# Patient Record
Sex: Male | Born: 1990 | Race: Black or African American | Hispanic: No | Marital: Single
Health system: Southern US, Community
[De-identification: ages and names within clinical notes are randomized; demographics above are authoritative.]

---
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
## 2012-03-08 ENCOUNTER — Ambulatory Visit: Payer: Self-pay | Admitting: Family Medicine

## 2012-03-20 ENCOUNTER — Emergency Department: Payer: Self-pay | Admitting: Emergency Medicine

## 2012-03-20 LAB — ETHANOL
Ethanol %: <0.003 % (ref 0.000–0.080)
Ethanol: <3 mg/dL

## 2012-03-20 LAB — CBC
HCT: 44.7 % (ref 40.0–52.0)
HGB: 15.7 g/dL (ref 13.0–18.0)
MCH: 31.4 pg (ref 26.0–34.0)
MCHC: 35.2 g/dL (ref 32.0–36.0)
MCV: 89 fL (ref 80–100)
RDW: 13.4 % (ref 11.5–14.5)

## 2012-03-20 LAB — COMPREHENSIVE METABOLIC PANEL
Anion Gap: 9 (ref 7–16)
BUN: 12 mg/dL (ref 7–18)
Bilirubin,Total: 0.5 mg/dL (ref 0.2–1.0)
Chloride: 106 mmol/L (ref 98–107)
Creatinine: 1.46 mg/dL — ABNORMAL HIGH (ref 0.60–1.30)
EGFR (African American): >60
Osmolality: 279 (ref 275–301)
Potassium: 3.3 mmol/L — ABNORMAL LOW (ref 3.5–5.1)
SGOT(AST): 65 U/L — ABNORMAL HIGH (ref 10–41)
Total Protein: 7.4 g/dL (ref 6.4–8.6)

## 2012-03-20 LAB — SALICYLATE LEVEL: Salicylates, Serum: <1.7 mg/dL

## 2012-03-20 LAB — ACETAMINOPHEN LEVEL: Acetaminophen: <2 ug/mL

## 2012-03-20 LAB — TSH: Thyroid Stimulating Horm: 1.71 u[IU]/mL

## 2012-03-21 LAB — DRUG SCREEN, URINE
Amphetamines, Ur Screen: NEGATIVE (ref ?–1000)
Barbiturates, Ur Screen: NEGATIVE (ref ?–200)
Benzodiazepine, Ur Scrn: NEGATIVE (ref ?–200)
Cannabinoid 50 Ng, Ur ~~LOC~~: NEGATIVE (ref ?–50)
Cocaine Metabolite,Ur ~~LOC~~: NEGATIVE (ref ?–300)
MDMA (Ecstasy)Ur Screen: NEGATIVE (ref ?–500)
Opiate, Ur Screen: NEGATIVE (ref ?–300)
Phencyclidine (PCP) Ur S: NEGATIVE (ref ?–25)

## 2012-03-22 ENCOUNTER — Ambulatory Visit: Payer: Self-pay | Admitting: Family Medicine

## 2012-06-15 ENCOUNTER — Ambulatory Visit: Payer: Self-pay | Admitting: Family Medicine

## 2012-07-24 LAB — DRUG SCREEN, URINE
Methadone, Ur Screen: NEGATIVE (ref ?–300)
Phencyclidine (PCP) Ur S: NEGATIVE (ref ?–25)
Tricyclic, Ur Screen: NEGATIVE (ref ?–1000)

## 2012-07-24 LAB — CBC
HCT: 44.1 % (ref 40.0–52.0)
HGB: 14.8 g/dL (ref 13.0–18.0)
MCHC: 33.5 g/dL (ref 32.0–36.0)
Platelet: 180 10*3/uL (ref 150–440)
RBC: 4.84 10*6/uL (ref 4.40–5.90)
WBC: 6.1 10*3/uL (ref 3.8–10.6)

## 2012-07-24 LAB — URINALYSIS, COMPLETE
Bilirubin,UR: NEGATIVE
Glucose,UR: 50 mg/dL (ref 0–75)
Leukocyte Esterase: NEGATIVE
RBC,UR: <1 /HPF (ref 0–5)
Specific Gravity: 1.028 (ref 1.003–1.030)
WBC UR: 1 /HPF (ref 0–5)

## 2012-07-24 LAB — COMPREHENSIVE METABOLIC PANEL
Albumin: 3.9 g/dL (ref 3.4–5.0)
Anion Gap: 3 — ABNORMAL LOW (ref 7–16)
Bilirubin,Total: 0.8 mg/dL (ref 0.2–1.0)
Calcium, Total: 9.1 mg/dL (ref 8.5–10.1)
Co2: 30 mmol/L (ref 21–32)
Creatinine: 1.43 mg/dL — ABNORMAL HIGH (ref 0.60–1.30)
EGFR (African American): >60
EGFR (Non-African Amer.): >60
Glucose: 111 mg/dL — ABNORMAL HIGH (ref 65–99)
Potassium: 3.6 mmol/L (ref 3.5–5.1)
SGPT (ALT): 56 U/L (ref 12–78)
Sodium: 135 mmol/L — ABNORMAL LOW (ref 136–145)

## 2012-07-25 ENCOUNTER — Inpatient Hospital Stay: Payer: Self-pay | Admitting: Psychiatry

## 2012-07-31 ENCOUNTER — Ambulatory Visit: Payer: Self-pay | Admitting: Family Medicine

## 2012-08-13 ENCOUNTER — Ambulatory Visit: Payer: Self-pay | Admitting: Family Medicine

## 2012-08-13 LAB — DRUG SCREEN, URINE
Barbiturates, Ur Screen: NEGATIVE (ref ?–200)
Benzodiazepine, Ur Scrn: NEGATIVE (ref ?–200)
Cocaine Metabolite,Ur ~~LOC~~: NEGATIVE (ref ?–300)
MDMA (Ecstasy)Ur Screen: NEGATIVE (ref ?–500)
Methadone, Ur Screen: NEGATIVE (ref ?–300)
Opiate, Ur Screen: NEGATIVE (ref ?–300)
Phencyclidine (PCP) Ur S: NEGATIVE (ref ?–25)

## 2012-08-13 LAB — COMPREHENSIVE METABOLIC PANEL
Albumin: 3.9 g/dL (ref 3.4–5.0)
Alkaline Phosphatase: 93 U/L (ref 50–136)
Anion Gap: 10 (ref 7–16)
BUN: 14 mg/dL (ref 7–18)
Calcium, Total: 9 mg/dL (ref 8.5–10.1)
Chloride: 104 mmol/L (ref 98–107)
Co2: 25 mmol/L (ref 21–32)
Creatinine: 1.16 mg/dL (ref 0.60–1.30)
EGFR (Non-African Amer.): >60
Glucose: 75 mg/dL (ref 65–99)
Osmolality: 277 (ref 275–301)
Potassium: 3.8 mmol/L (ref 3.5–5.1)

## 2012-08-13 LAB — CBC
HCT: 44.3 % (ref 40.0–52.0)
MCH: 30.6 pg (ref 26.0–34.0)
MCV: 89 fL (ref 80–100)
Platelet: 188 10*3/uL (ref 150–440)
RBC: 4.96 10*6/uL (ref 4.40–5.90)

## 2012-08-13 LAB — URINALYSIS, COMPLETE
Bilirubin,UR: NEGATIVE
Blood: NEGATIVE
Glucose,UR: NEGATIVE mg/dL (ref 0–75)
Leukocyte Esterase: NEGATIVE
RBC,UR: <1 /HPF (ref 0–5)
WBC UR: 1 /HPF (ref 0–5)

## 2012-08-13 LAB — TROPONIN I: Troponin-I: <0.02 ng/mL

## 2012-08-14 LAB — AMMONIA: Ammonia, Plasma: 26 mcmol/L (ref 11–32)

## 2012-08-15 ENCOUNTER — Inpatient Hospital Stay: Payer: Self-pay | Admitting: Psychiatry

## 2013-02-11 ENCOUNTER — Ambulatory Visit: Payer: Self-pay | Admitting: Family Medicine

## 2013-02-28 ENCOUNTER — Ambulatory Visit: Payer: Self-pay | Admitting: Family Medicine

## 2013-06-10 ENCOUNTER — Ambulatory Visit: Payer: Self-pay | Admitting: Family Medicine

## 2013-07-16 ENCOUNTER — Ambulatory Visit: Payer: Self-pay | Admitting: Family Medicine

## 2013-07-19 ENCOUNTER — Emergency Department: Payer: Self-pay | Admitting: Emergency Medicine

## 2013-07-19 LAB — COMPREHENSIVE METABOLIC PANEL
ALT: 73 U/L (ref 12–78)
Albumin: 3.5 g/dL (ref 3.4–5.0)
Alkaline Phosphatase: 68 U/L
Anion Gap: 3 — ABNORMAL LOW (ref 7–16)
BUN: 15 mg/dL (ref 7–18)
Bilirubin,Total: 0.7 mg/dL (ref 0.2–1.0)
CALCIUM: 9.2 mg/dL (ref 8.5–10.1)
Chloride: 105 mmol/L (ref 98–107)
Co2: 31 mmol/L (ref 21–32)
Creatinine: 1.39 mg/dL — ABNORMAL HIGH (ref 0.60–1.30)
EGFR (African American): >60
EGFR (Non-African Amer.): >60
Glucose: 78 mg/dL (ref 65–99)
Osmolality: 277 (ref 275–301)
Potassium: 4.1 mmol/L (ref 3.5–5.1)
SGOT(AST): 55 U/L — ABNORMAL HIGH (ref 15–37)
SODIUM: 139 mmol/L (ref 136–145)
Total Protein: 7.7 g/dL (ref 6.4–8.2)

## 2013-07-19 LAB — CBC
HCT: 43.7 % (ref 40.0–52.0)
HGB: 14.5 g/dL (ref 13.0–18.0)
MCH: 30.4 pg (ref 26.0–34.0)
MCHC: 33.2 g/dL (ref 32.0–36.0)
MCV: 92 fL (ref 80–100)
PLATELETS: 246 10*3/uL (ref 150–440)
RBC: 4.77 10*6/uL (ref 4.40–5.90)
RDW: 14.1 % (ref 11.5–14.5)
WBC: 5.9 10*3/uL (ref 3.8–10.6)

## 2013-07-19 LAB — URINALYSIS, COMPLETE
Bacteria: NONE SEEN
Bilirubin,UR: NEGATIVE
Glucose,UR: 50 mg/dL (ref 0–75)
Ketone: NEGATIVE
LEUKOCYTE ESTERASE: NEGATIVE
Nitrite: NEGATIVE
PH: 5 (ref 4.5–8.0)
Protein: NEGATIVE
RBC,UR: <1 /HPF (ref 0–5)
Specific Gravity: 1.016 (ref 1.003–1.030)
WBC UR: 1 /HPF (ref 0–5)

## 2013-07-19 LAB — LIPASE, BLOOD: LIPASE: 118 U/L (ref 73–393)

## 2013-07-21 ENCOUNTER — Emergency Department: Payer: Self-pay | Admitting: Emergency Medicine

## 2013-07-21 LAB — CBC
HCT: 41.2 % (ref 40.0–52.0)
HGB: 13.8 g/dL (ref 13.0–18.0)
MCH: 30.5 pg (ref 26.0–34.0)
MCHC: 33.6 g/dL (ref 32.0–36.0)
MCV: 91 fL (ref 80–100)
PLATELETS: 221 10*3/uL (ref 150–440)
RBC: 4.54 10*6/uL (ref 4.40–5.90)
RDW: 13.5 % (ref 11.5–14.5)
WBC: 5.4 10*3/uL (ref 3.8–10.6)

## 2013-07-21 LAB — COMPREHENSIVE METABOLIC PANEL
ALK PHOS: 67 U/L
Albumin: 3.6 g/dL (ref 3.4–5.0)
Anion Gap: 5 — ABNORMAL LOW (ref 7–16)
BILIRUBIN TOTAL: 0.7 mg/dL (ref 0.2–1.0)
BUN: 10 mg/dL (ref 7–18)
CHLORIDE: 105 mmol/L (ref 98–107)
Calcium, Total: 8.9 mg/dL (ref 8.5–10.1)
Co2: 28 mmol/L (ref 21–32)
Creatinine: 1.32 mg/dL — ABNORMAL HIGH (ref 0.60–1.30)
Glucose: 78 mg/dL (ref 65–99)
Osmolality: 274 (ref 275–301)
POTASSIUM: 4 mmol/L (ref 3.5–5.1)
SGOT(AST): 40 U/L — ABNORMAL HIGH (ref 15–37)
SGPT (ALT): 56 U/L (ref 12–78)
Sodium: 138 mmol/L (ref 136–145)
Total Protein: 7.7 g/dL (ref 6.4–8.2)

## 2013-07-21 LAB — DRUG SCREEN, URINE

## 2013-07-21 LAB — URINALYSIS, COMPLETE
BACTERIA: NONE SEEN
Bilirubin,UR: NEGATIVE
Blood: NEGATIVE
LEUKOCYTE ESTERASE: NEGATIVE
Nitrite: NEGATIVE
PH: 7 (ref 4.5–8.0)
Protein: NEGATIVE
RBC,UR: 1 /HPF (ref 0–5)
SQUAMOUS EPITHELIAL: NONE SEEN
Specific Gravity: 1.015 (ref 1.003–1.030)
WBC UR: <1 /HPF (ref 0–5)

## 2013-07-21 LAB — LIPASE, BLOOD: Lipase: 92 U/L (ref 73–393)

## 2013-07-21 LAB — TROPONIN I: Troponin-I: <0.02 ng/mL

## 2013-07-21 LAB — VALPROIC ACID LEVEL: Valproic Acid: <3 ug/mL — ABNORMAL LOW

## 2013-07-21 LAB — D-DIMER(ARMC): D-Dimer: 227 ng/ml

## 2013-07-21 LAB — CK: CK, Total: 538 U/L — ABNORMAL HIGH

## 2013-08-29 ENCOUNTER — Ambulatory Visit: Payer: Self-pay | Admitting: Family Medicine

## 2013-12-05 ENCOUNTER — Ambulatory Visit: Payer: Self-pay

## 2013-12-29 ENCOUNTER — Ambulatory Visit: Payer: Self-pay | Admitting: Family Medicine

## 2013-12-31 ENCOUNTER — Ambulatory Visit: Payer: Self-pay | Admitting: Family Medicine

## 2014-01-14 ENCOUNTER — Ambulatory Visit: Payer: Self-pay | Admitting: Family Medicine
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
## 2014-08-08 NOTE — Discharge Summary (Signed)
PATIENT NAME:  Brian Hardin, Larsen J MR#:  [id number] DATE OF BIRTH:  03-03-1991  DATE OF ADMISSION:  07/25/2012 DATE OF DISCHARGE:  07/30/2012  HOSPITAL COURSE: See dictated history and physical for details of admission. A 24 year old man was admitted to the hospital after being picked up by Designer, television/film setlaw enforcement officers. He had been wandering around, looking confused and dazed and was reporting having thoughts of killing himself and wanting to die. In the hospital, he recovered from his headache and after a day, he was denying any suicidal ideation. The patient showed initially some enthusiastic cooperation with treatment, but quickly stated that he felt fine and wanted to be discharged. He tended to minimize or deny symptoms. Psychological testing was done, which suggested a very defensive profile with a tendency to minimize symptoms. It did not suggest any clear psychosis. The patient was treated with fluoxetine 20 mg a day as well as p.r.n. Ambien for his depression. He tolerated medication well had no side effects.  He did not display any dangerous or suicidal behavior in the hospital.   PLAN AT DISCHARGE: Was for him to follow up with Dr. Maryruth BunKapur as an outpatient psychiatrist. 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 He was not reporting any psychotic symptoms, delusions or hallucinations.   DISCHARGE MEDICATIONS: Prozac 20 mg p.o. daily, Ambien 10 mg p.o. at bedtime p.r.n. for sleep, Maxalt-MLT 10 mg q. 2 hours p.r.n. for headache.   LABORATORY RESULTS: Admission labs showed a drug screen which was negative, TSH normal at 1.1. Chemistry panel with slightly elevated glucose 111 and creatinine elevated at 1.43, sodium slightly low 135. Hematology panel all normal. Urinalysis unremarkable.    MENTAL STATUS EXAM AT DISCHARGE: Casually dressed, neatly groomed man who looks his stated age, cooperative with the interview. Eye contact good. Psychomotor activity normal.  Speech decreased in total amount, but normal in volume, easy to understand. Thoughts appear lucid. No evidence of loosening of associations or delusions. Denies auditory or visual hallucinations. Denies suicidal or homicidal ideation. Shows improved judgment and insight. Normal intelligence. Alert and oriented x 4.   DISPOSITION: [facility] to [facility] with their consent and follow up with Dr. Maryruth BunKapur.   DIAGNOSIS, PRINCIPAL AND PRIMARY:  AXIS I: Major depression, severe and recurrent.   SECONDARY DIAGNOSES: AXIS I: Rule out posttraumatic stress disorder.   AXIS II: No diagnosis.   AXIS III: Migraine headaches and mild asthma.   AXIS IV: Severe from worries about family and burden of current illness.   AXIS V: Functioning at time of discharge 60.    ____________________________ Audery AmelJohn T. Clapacs, MD jtc:cc D: 08/16/2012 17:21:15 ET T: 08/16/2012 21:25:07 ET JOB#: [id number]  cc: Audery AmelJohn T. Clapacs, MD, <Dictator> Audery AmelJOHN T CLAPACS MD ELECTRONICALLY SIGNED 08/16/2012 22:00
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
## 2014-08-08 NOTE — Consult Note (Signed)
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   Patient was seen by consultant.   Consult note dictated.   Orders entered.   Comments: Pt [facility]C by [employer]. recently d/c from inpt unit. he stated that he was having the headache and does not remember the episode. Collateral info from Dr Maryruth BunKapur.Pt denied SI/HI or plans.   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   Plan; Will start Depakote EC 500mg  po qpm which will help with mood stabilization and migraines as well.  Decrease Prozac to 10mg  in am as it increase the headaches in most pts.  If he is stable in am, can be discharged after discussing the case with [employer] and the treating outpt provider Dr Maryruth BunKapur.  Electronic Signatures: Rhunette CroftFaheem, Kabria Hetzer S (MD)  (Signed 29-Apr-14 16:02)  Authored: Brief Consult Note   Last Updated: 29-Apr-14 16:02 by Rhunette CroftFaheem, Makynleigh Breslin S (MD)

## 2014-08-08 NOTE — Consult Note (Signed)
Psychological Assessment  Brian SabalJeremy Gloston20of Evaluation: 4-11-14Administered: [place] Multiphasic Personality Inventory-2 (MMPI-2) for Referral: Brian Hardin was referred for a psychological assessment by his physician, Donato HeinzJ. Terry Clapacs, MD.  He was admitted to Behavioral Medicine for the treatment of increasing depression and suicidal ideation.  Please see the history and physical and psychosocial history for further background information. An assessment of personality structure was requested. Brian Hardin?s MMPI-2 protocol is compared to that of other adult males he obtained the following profile: 9?+[id number]#. The MMPI-2 validity scales indicate that the clinical profile is valid. Brian Hardin is attempting to present himself in a favorable light. It is probable that he is avoiding admitting to distressing feelings or thoughts. His profile most likely represents a "flight into health." PresentationHe reports that he is not experiencing any emotional distress. He is happy most of the time. His daily life is full of things that keep him interested. Once a week or more often he becomes very excited. He has periods in which he feels unusually cheerful without any special reason. When he gets bored, he likes to stir up some excitement. Something exciting will almost always pull him out of it when he is feeling low. He has very few fears compared to his friends.  He reports that his attention, concentration and memory seem to be all right. He does not express himself directly to others although he likes to let people know where he stands on things and he finds it necessary to stand up for what he thinks is right. His judgment is better than it ever has been. At times he thinks that he can make up his mind with unusually great ease. He is self-confident.  Relations: He reports that he is very extroverted. He is very sociable and makes friends quickly. He enjoys social gatherings and parties and the excitement  of a crowd. He likes making decisions and assigning jobs to others and believes, if given the chance, he would make a good leader of people.  Problem Areas: He reports that he is in good physical health. He sleeps very well and wakes up fresh and rested most mornings. He usually has enough energy to do his work and is about as able to work as he ever was.  His prognosis is poor because he is experiencing little emotional distress, limiting his motivation for any intervention. Short-term, behavioral interventions that focus on his reasons for entering treatment may be effective. There are three specific issues that must be kept in mind when establishing and maintaining the therapeutic alliance: he may have unrealistic ideas of his self-importance, he is happy most of the time and he sees little that needs to be changed. Impression:into Health" profileBipolar Disorder   Electronic Signatures: Carola FrostRoush, Lee Ann (PsyD, HSP-P)  (Signed on 11-Apr-14 16:09)  Authored  Last Updated: 11-Apr-14 16:09 by Carola Frostoush, Lee Ann (PsyD, HSP-P)

## 2014-08-08 NOTE — Consult Note (Signed)
Brief Consult Note: Diagnosis: Dissociative episode, migraine.   Patient was seen by consultant.   Discussed with Attending MD.   Comments: Patient was not have recently have an episode characterized by worse than normal headache followed by aggressive behavior and reported amnesia to the aggressive behavior.  He says he was doing his normal activities and the next thing he knew he woke up in the hospital.  Patient reports feeling depressed and even suicidal before but denies amnesia with any other events, to me today.  However, it appears that the primary team is aware of additional dissociative events though it is unclear if there was reported amnesia to these events as well.  In any case, the patient describes another event in his past where his eyes rolled back and he had shaking, apparently this event was witnessed.  I have discussed at length with him today that the etiology of these events is unclear.  His routine EEG was normal.  However, the majority of patients with epilepsy have normal first routine EEGs.  It is possible that temporal lobe seizure, frontal lobe seizure, or other complex partial seizure could result in positive symptoms of abnormal behaviors in addition to amnesia to the events.  This patient should have an overnight EEG for 72 hours to evaluate for the possibility of ictal vs interictal epileptiform activity given this concern and the fact that these events are significantly interfering with his normal activities.  In any case, I strongly agree with the primary team's plan to utilize a medication that has dual benefit for mood stabilization along with anticonvulsant properties such as depakote or tegretol.  Agree with primary team that the better choice at present is probably depakote given that it is effective for nearly all major seizure type whereas tegretol is less effective in treating certain primary generalized epilepsies.    Theora MasterZachary Gregg Winchell, MD.  Electronic  Signatures: Morene CrockerPotter, Salaya Holtrop E (MD)  (Signed [phone]-May-14 09:02)  Authored: Brief Consult Note   Last Updated: 08-May-14 09:02 by Morene CrockerPotter, Cass Edinger E (MD)

## 2014-08-08 NOTE — Consult Note (Signed)
Psychological Assessment  Ardath Sax Evaluation: 5-7-14Administered: Rorschach Psycho diagnostic for Referral: Mr. Luallen was referred for a psychological assessment by his physician, Christoper Allegra, MD. He was admitted to [facility] for diagnostic clarification of bizarre and threatening behavior. He was recently discharged from [facility] with a diagnosis of major depression and was taking his medication prior to his readmission. Please see the history and physical and psychosocial history for further background information. An assessment for possible underlying psychosis was requested. Mr. Vitelli participated appropriately during testing. He attempted all tasks request of him and appeared to try his best. He was somewhat hesitant with responses at first but as he warmed to the task he became quite verbal in his participation. He did not reject any cards and provided an adequate number of responses. His performance is considered a valid indication of current functioning. of TestingMr. Crossin is not very introspective. He is not paying sufficient attention to himself and may even be avoiding self-focusing. He may be comparing himself unfavorably to the other people in his environment that he sees as more accomplished or more worthwhile than he sees himself. This negative view of himself can contribute to depressive symptoms. His lack of self awareness also limits his appreciation of his impact on other people and contributes to a limited capacity to examine himself in a critical fashion and then modify his behavior accordingly. Additionally, he is concerned about his body and/or its functioning. 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 He may have  an image of himself as a fragile or vulnerable person. Perceptions: He is likely to have difficulty managing relationships with other people in a comfortable and rewarding manner. He may limit his  relationships because of concerns that he cannot meet other?s expectations. His relationships will be transient and superficial. He may also be more dependent on others for direction and support than most others his age. He may have nave ideas of how tolerant other people will be of his needs and demands. This naivet leads him to experience disappointment in his interpersonal relationships and may lead to feelings of resentment toward those who have been unresponsive to his needs. His needs for closeness are not being met and he is likely to feel lonely, emotionally deprived and interpersonally needy. Because of this dependency in interpersonal relationships he is vulnerable to being exploited or manipulated by people to whom he reaches out in his excessive neediness. Processing: Mr. Platte lacks adequate openness to experience and has an avoidant style in which he tends to view himself and his world with an overly narrow focus of attention. He is likely to overlook the subtle nuances of various situations and arrive at decision without having given much thought to the chosen behavior. He has little tolerance for uncertainty or ambiguity and he will feel most comfortable in clearly defined and well structured situations. He may tend to be careless and feel satisfied with final products that do not reflect the full measure of his ability. By taking inadequate account of information he could easily process, he is at risk for errors of oversight in what he chooses to think and do and for lack of accomplishment in what he attempts to achieve. Mediation: He is quite likely to misperceive events and form mistaken impression of people and what their actions signify. This tendency leads him to frequently fail to anticipate the consequences of his actions and to misconstrue the boundaries of appropriate behavior. His inaccurate  perceptions of people and events are likely to lead him to erroneous conclusions and  ill-advised actions, and faulty judgment is likely to undermine the adequacy of his adjustment. His confusion in separating reality from fantasy and the inappropriate behaviors to which it can lead appear to constitute a chronic and pervasive source of adjustment difficulties in his life. He can think logically and coherently. He is as capable as most others of coming to reasonable conclusions about relationships between events and of maintaining a connected flow of associations in which ideas follow each other in a comprehensible manner.  Recommendations: Mr. Brill is likely to have experience symptoms of depression. He tends to experience difficulties in interpersonal relationships. His needs for closeness are not being met contributing to his experience of depression and leaving him vulnerable to being manipulated by others. He responds best in situations which are structured and he knows what to expect. He easily misinterprets the world around him. This tendency may lead to inappropriate behavior or faulty judgment. If he is willing to consider his experience in the world he is capable of coming to reasonable conclusions about his experience. Evaulation for anti depressent medication and cognitive behavioral therapy is reccommeded. Impression:Depressive Disorder                            Electronic Signatures: Roush, Lee Ann (PsyD, HSP-P)  (Signed on 08-May-14 16:06)  Authored  Last Updated: 08-May-14 16:06 by Roush, Lee Ann (PsyD, HSP-P)  

## 2014-08-08 NOTE — Discharge Summary (Signed)
PATIENT NAME:  Brian Hardin, Brian Hardin MR#:  [id number] DATE OF BIRTH:  November 07, 1990  DATE OF ADMISSION:  08/15/2012 DATE OF DISCHARGE:  08/30/2012  HOSPITAL COURSE: See dictated history and physical for details of admission. This 24 year old man, student at [employer], came into the Emergency Room under involuntary commitment. He had been waving a knife around at his living place at the university. He had been reported as being confused and stating that he was not Jaidin but was some other person. There was concern that he had been intending to hurt himself with the knife. Ultimately, he had put a hole in the wall either with the knife or his fist. The patient appeared to be confused initially.  Dr. Maryruth Bun happened to come to the Emergency Room and see the patient when he came in and spoke to him at that time. By her report, the patient said at that time that he remembered the incident and that he was trying to hurt himself or kill himself with the knife. On my subsequent interview with the patient, he has consistently claimed that he does not remember the incident at all. He has reported that his mood feels okay. He has denied depressive symptoms. He has denied any psychotic symptoms. He has not shown any behavior consistent with psychotic features on the unit. The patient was treated with Prozac for depression and possible PTSD and also with Depakote empirically on the grounds that there were some features of his condition similar to seizures. He tolerated the Depakote well and was titrated up to 1000 mg at night. He had a blood level in the high 90s checked prior to discharge. We did have a neurologist evaluate him while he was here. An EEG was performed. The EEG did not show any epileptiform activity. The neurologist thought there was still a possibility, based on the patient's history, that this could be a seizure phenomenon. The neurologist recommended a several-day long-term EEG monitoring to evaluate this.  He also agreed with the plan to treat with an antiepileptic medicine. The patient had followup psychological testing done during this hospital stay. He had previously had an MMPI done which had suggested depression. This time, he was tested using a Rorschach test. The reading suggested that he was not very introspective and that there was some low self esteem. There did not seem to be evidence directly of psychosis. It was supportive of depression. The patient attended groups intermittently. He was generally cooperative although somewhat passive. Much of this hospital stay was focused on his desire to be discharged. [employer] communicated to [place] that they did not feel that he should be returning to the [place] until there could be some assurance given them that he was not at risk of having a repeat of his symptoms and behaviors. The patient took this as meaning that all I had to do was give the word and he would be able to go back to college. On several occasions I tried to explain to him that this really was not what was expected, but rather he needed to go home and receive longer-term mental health stabilization and treatment before readdressing [employer]. The patient continued to insist that he only wanted to return to [place] and not go home. We finally had a representative from [facility] come and meet with all of [place] and explain things, and at that time I explained to the patient that I could not, in good judgment, say that he was not at risk for  a return episode of his behavior. At that point, the patient changed his mind and agreed to go back to [place] family. His mother and father both arrived here this morning and took the patient with the plan that they will [facility] [employer] and then go back to [place]. We have made efforts to find outpatient psychiatric treatment for him in the area but have been unable to locate anyone who can give an appointment to see him in the  immediate future. The patient and his family were both given a list of multiple providers to contact in the area. The patient has been briefed and educated about his condition.  He has been advised to continue his medication. He has been advised to keep a regular sleep schedule, stay away from alcohol and drugs, stay away from highly disturbing emotional experiences. He is strongly encouraged to involve himself in outpatient mental health treatment. He was advised that if he sees a return of any mood problems or has any thoughts about self harm or any psychotic symptoms, he needed to notify someone immediately. He agreed to all of this.   LABORATORY RESULTS:  The Depakote level done on May 11th was 97. The chemistry panel done on admission was normal except for a slightly elevated AST. Drug screen negative. CBC unremarkable. Alcohol undetected. Ammonia normal. Urinalysis positive for protein, otherwise unremarkable. Salicylates and acetaminophen undetected.   EKG: Sinus bradycardia, probably consistent with his being in extremely good physical health. Head CT: No abnormality. Chest x-ray: Hyperinflation possibly related to asthma. No other finding.   DISCHARGE MEDICATIONS: Prozac 20 mg q. a.m., Depakote 1000 mg at night.   MENTAL STATUS EXAM AT DISCHARGE: A neatly groomed, casually but neatly dressed young man who looks his stated age. Cooperative with the interview. Eye contact good. Psychomotor activity subdued but not abnormal. Speech quiet but easy to understand. Affect a little bit flattened. Mood stated as being okay. Thoughts are lucid without any sign of loosening of association or delusions. Denies auditory or visual hallucinations. Denies suicidal or homicidal ideation. Shows adequate judgment and insight overall. Normal intelligence. Alert and oriented x 4.   DISPOSITION: As noted above, return home with his parents. Follow-up outpatient psychiatric treatment.   DIAGNOSIS, PRINCIPAL AND  PRIMARY:  AXIS I: Depression, not otherwise specified.   SECONDARY DIAGNOSES: AXIS I:  1.  Rule out posttraumatic stress disorder.  2.  Rule out dissociative disorder, not otherwise specified.   AXIS II: Deferred.   AXIS III: Rule out seizure disorder, asthma.   AXIS IV: Severe from having to leave school.   AXIS V: Functioning at time of discharge 62.  ____________________________ Audery AmelJohn T. Clapacs, MD jtc:cb D: 08/30/2012 14:29:40 ET T: 08/30/2012 15:17:15 ET JOB#: [id number]  cc: Audery AmelJohn T. Clapacs, MD, <Dictator> Audery AmelJOHN T CLAPACS MD ELECTRONICALLY SIGNED 08/30/2012 18:21

## 2014-08-08 NOTE — Consult Note (Signed)
Brief Consult Note: Diagnosis: major depression.   Patient was seen by consultant.   Consult note dictated.   Recommend further assessment or treatment.   Orders entered.   Comments: Psychiatry:Patient seen. Major depression with suicidal ideation. [facility] See full note.  Electronic Signatures: Audery Amellapacs, Airyn Ellzey T (MD)  (Signed 09-Apr-14 18:10)  Authored: Brief Consult Note   Last Updated: 09-Apr-14 18:10 by Audery Amellapacs, Arneshia Ade T (MD)
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
## 2014-08-09 NOTE — Consult Note (Signed)
PATIENT NAME:  Brian Hardin, Brian Hardin MR#:  [id number] DATE OF BIRTH:  1991-01-20  DATE OF CONSULTATION:  07/21/2013  REFERRING PHYSICIAN:   CONSULTING PHYSICIAN:  Kandance Yano K. Amirah Goerke, MD  SUBJECTIVE:  The patient was seen in consultation, room #8 along with 2 or 3 male friends.  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  The patient was brought to the Emergency Room after he just had a fall on the  steps in front of his apartment when he was trying to leave the same.  The patient could not get up physically because he felt numb all over the body and he was aware of what is going on and was fully alert.  His roommates with whom he has been living were going out and they found him on the floor and called ambulance for help and was brought to the Emergency Room.  According to the information obtained, the patient was nonverbal, found at the bottom of the steps unresponsive and woke and blinks to communicate and no apparent trauma.    PAST PSYCHIATRIC HISTORY:  The patient reports that he was diagnosed with depression in 2014 by Dr. Toni Amendlapacs, [facility] Dr. Marguerite OleaMoffett and last appointment was a week ago, next appointment is coming up soon.    ALCOHOL AND DRUGS:  Denies.    MENTAL STATUS EXAMINATION:  The patient was alert and oriented to place, person and time, fully aware of the situation that brought him here.  He gives a history which appears to be reliable.  He knew the day, date, time, place, person and he recognized both of his male friends.  Affect is appropriate to his mood which was low and down and concerned and anxious about him not being able to move his body because of being numb and not having any feeling.  He reported to the undersigned that he just started using his neck and head and able to move his neck and head and able to verbalize and talk right now.  However, he reports that he is numb all over his  body and is not able to move the same.  Denies any ideas or plans to hurt himself or others and wants to get better and smiled, stating that he wants to go home after he gets better and is not numb anymore and was looking at his 2 male friends. while talking about the same.  No psychosis.  Denies also auditory or visual hallucinations, denies hearing voices or seeing things.  Memory is intact.  He could remember his date of birth and knew that his date of birth was special because he was born on [birth year].  Cognition is intact.  He knew the capital [place], capital of the [place] and name of current president.  Recall is good.  He could spell the word "world" forward and backward without any problems.  He could count money.  He knew that today date is special because it is Easter Sunday.  He absolutely denies any ideas or plans to hurt himself or others, wants to get better and go home.    IMPRESSION:  Rule out conversion reaction/ disorder, history of major depressive disorder and being followed [facility].    RECOMMEND:  I told the patient that he could recover his head and neck and hopefully slowly he will recover movement and functioning of  the rest of his body.  Discussed with our Emergency Room physician, who stated that he would like to observe the patient and then re-evaluate him in the a.m. of 07/22/2013.   ____________________________ Jannet Mantis. Guss Bunde, MD skc:cs D: 07/21/2013 20:32:00 ET T: 07/21/2013 20:47:11 ET JOB#: [id number]  cc: Monika Salk K. Guss Bunde, MD, <Dictator> Beau Fanny MD ELECTRONICALLY SIGNED 07/27/2013 21:41

## 2014-08-09 NOTE — Consult Note (Signed)
Brief Consult Note: Diagnosis: Major depressive disorder recurrent moderate.   Patient was seen by consultant.   Consult note dictated.   Recommend further assessment or treatment.   Orders entered.   Comments: [name] has a h/o depression. He was brought to the ER after a fall. No physical damage evidenced by CT scans. He has not been compliant with Celaxa prescribed last week for $$$. He is not suicidal or homicidal.   PLAN: 1. The patient no longer meets criteria for IVC. I will terminate proceedings. please discharge as appropriate.  2. He is to take Celexa 20 mg. Rx given. 7 days supply provided.  3. [facility].  4. [facility].  Electronic Signatures: Brian Hardin, Brian Hardin (MD)  (Signed 06-Apr-15 12:58)  Authored: Brief Consult Note   Last Updated: 06-Apr-15 12:58 by Brian Hardin, Brian Hardin (MD)

## 2015-09-13 IMAGING — CR CERVICAL SPINE - FLEXION AND EXTENSION VIEWS ONLY
1 series · 2 of 2 positions shown · non-contrast
Comparison: Cervical spine CT July 21, 2013

CLINICAL DATA: Tenderness post trauma

EXAM:
CERVICAL SPINE - FLEXION AND EXTENSION VIEWS ONLY

[Series 3: w cervical spine flexion · 0.14mm/px · 2 of 2 slices shown]
[im 1/2]
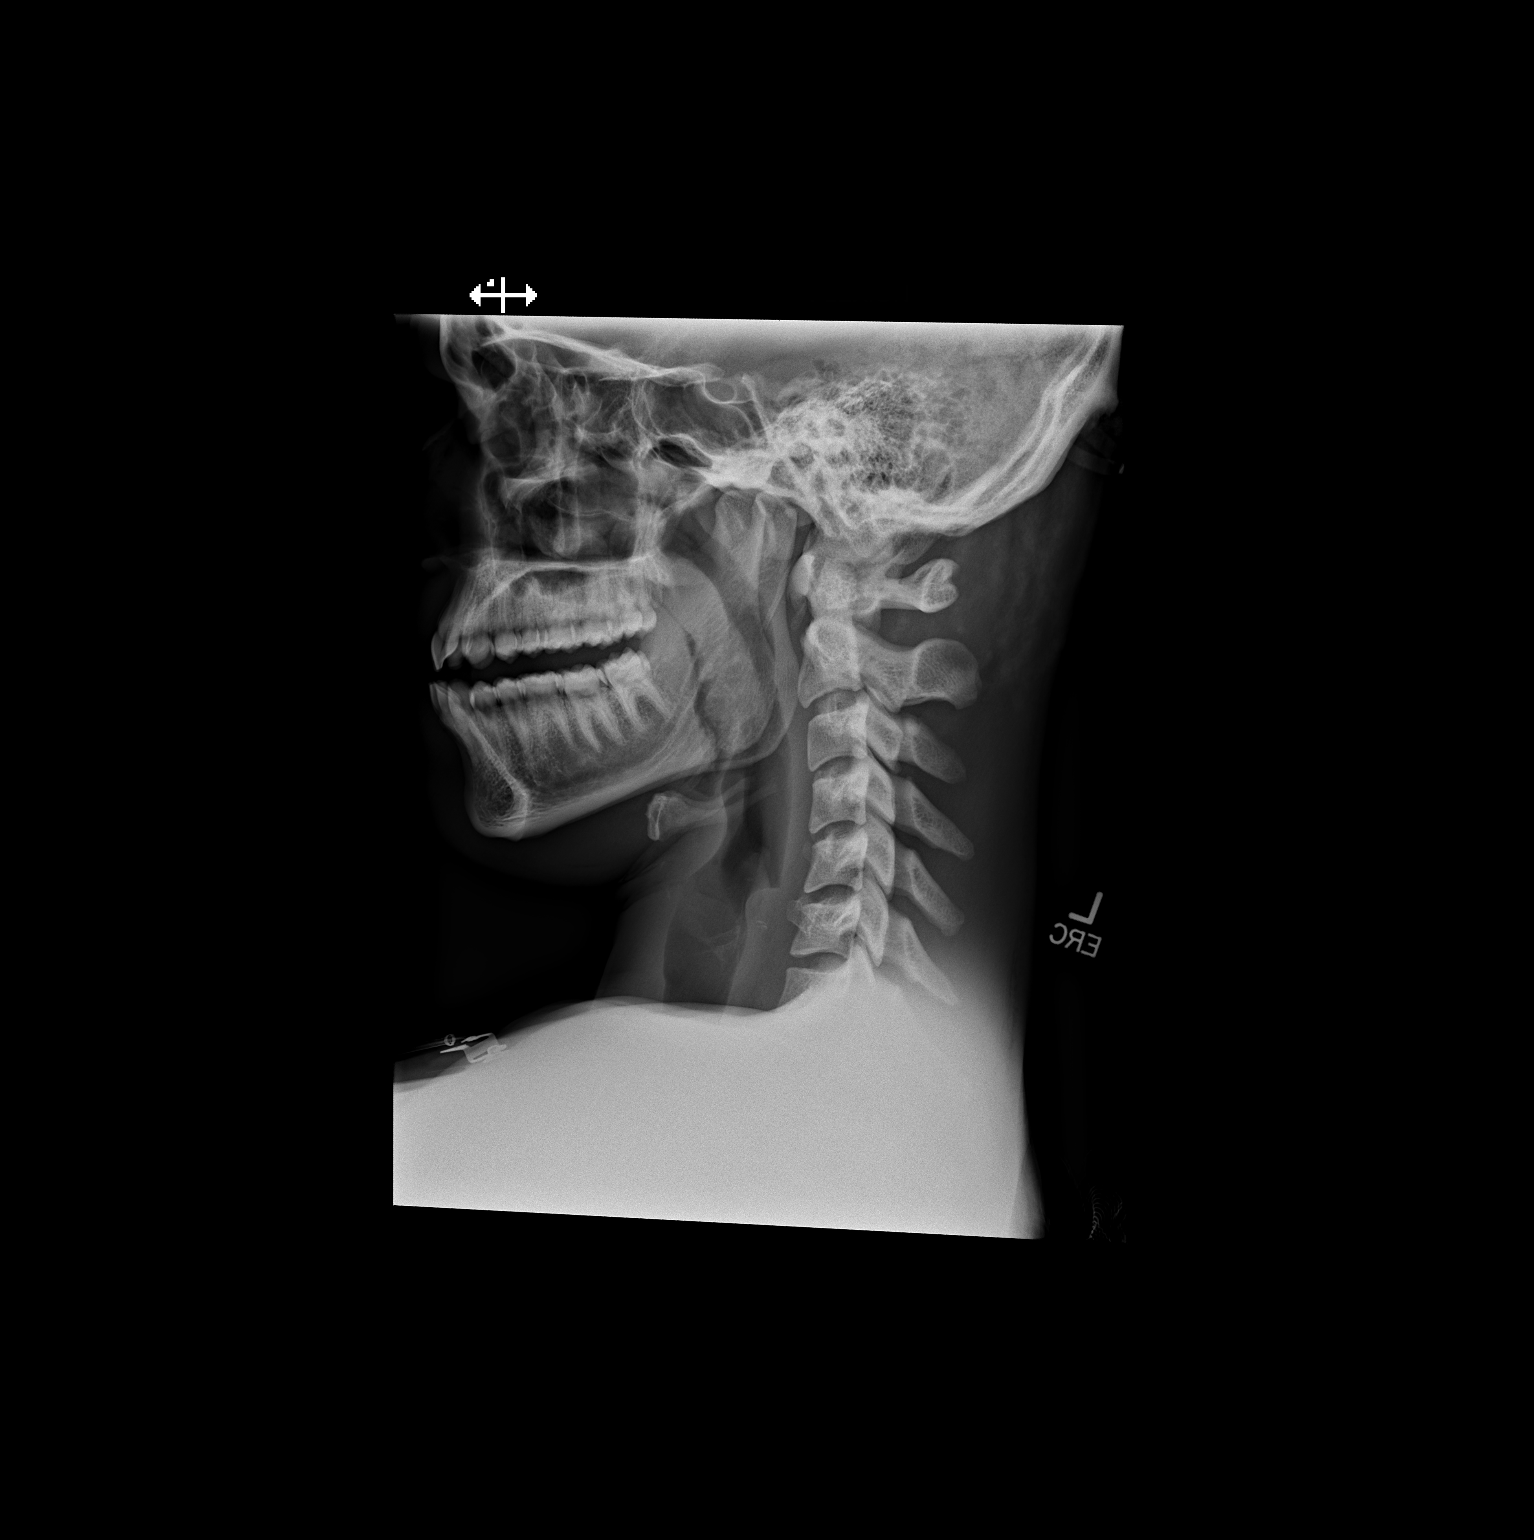
[im 2/2]
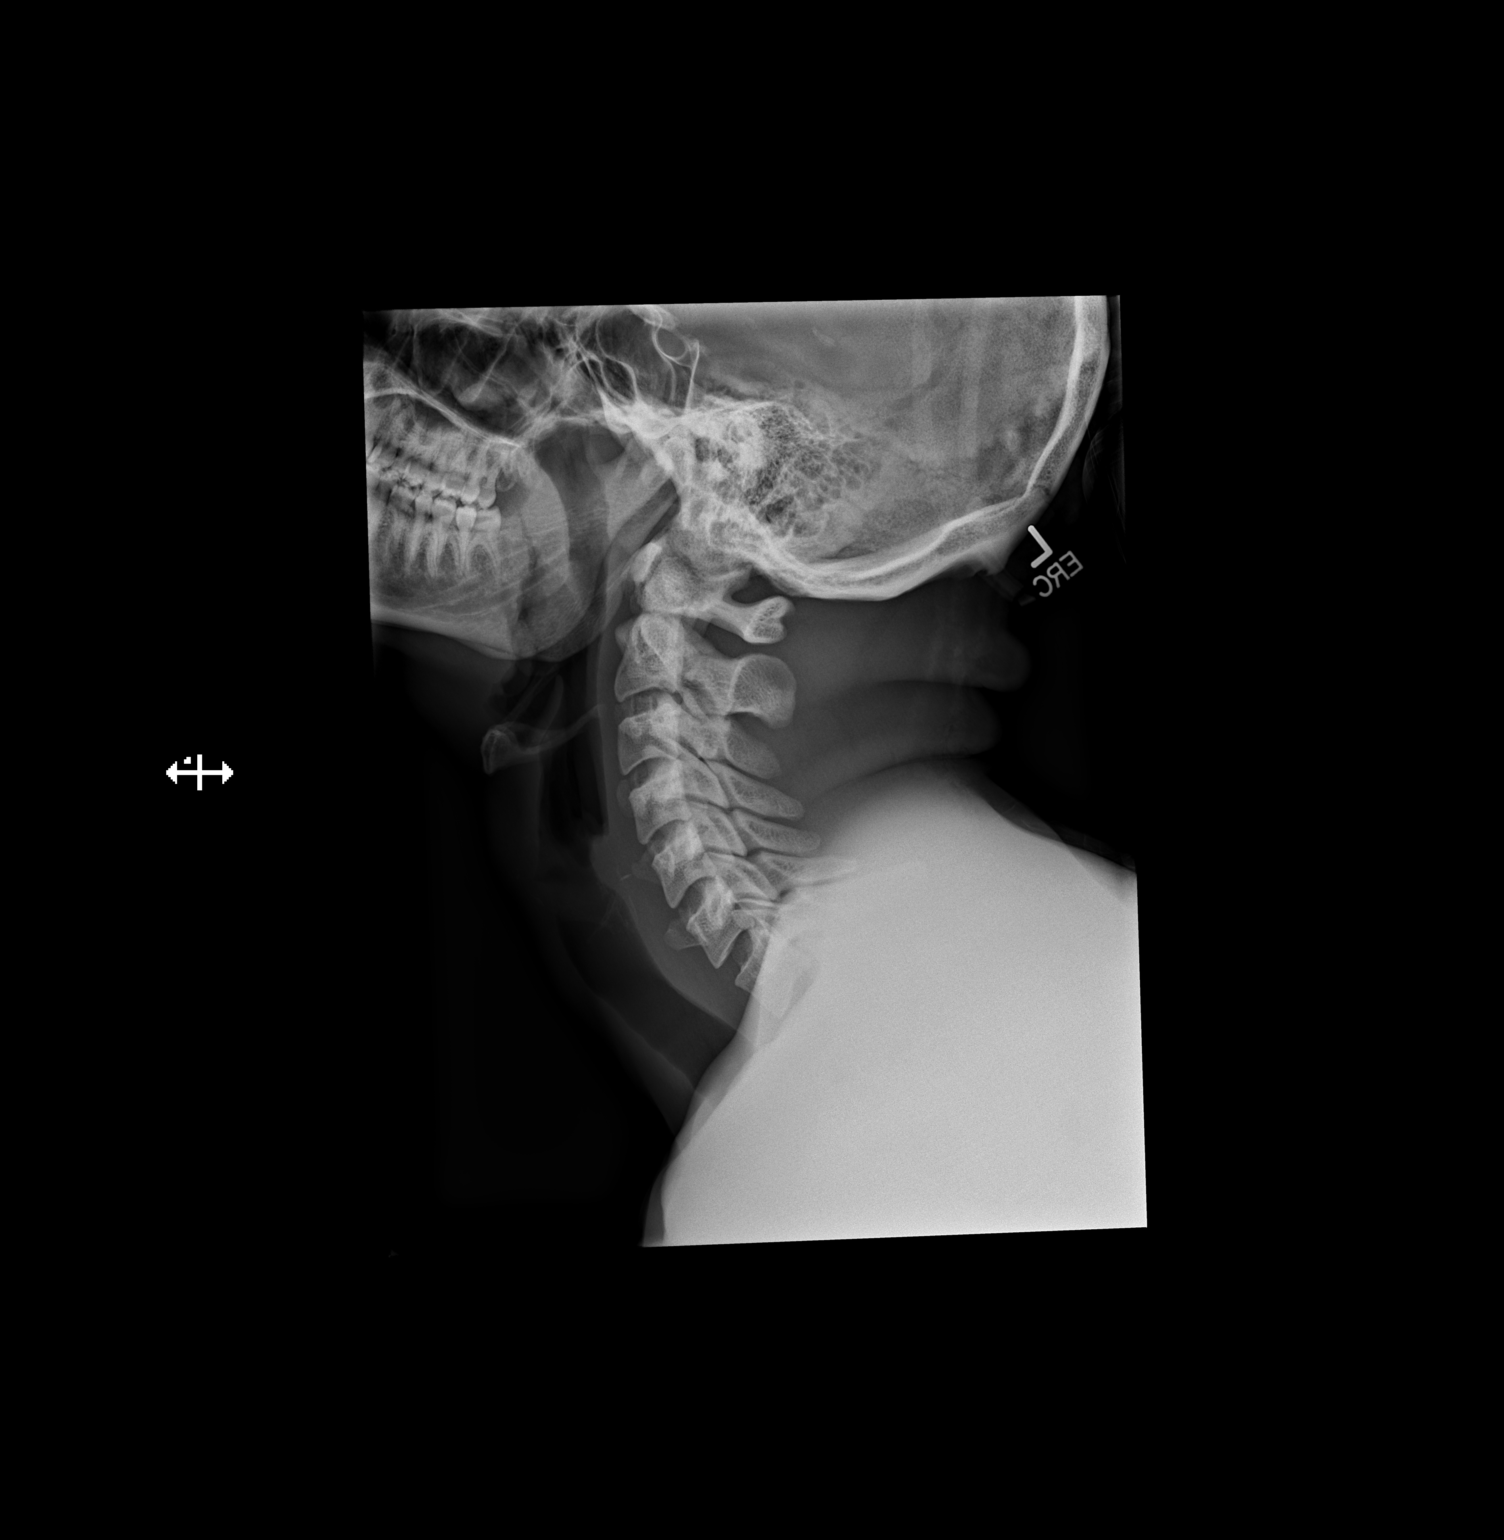

[2 of 2 positions shown; findings below may reference images not displayed]

FINDINGS: Lateral flexion-extension views were obtained. There is no fracture
or spondylolisthesis. Prevertebral soft tissues and predental space
regions are normal. Disc spaces appear intact. There is no change in
lateral alignment with flexion and extension.
IMPRESSION: No fracture or spondylolisthesis. No change in lateral alignment
with flexion and extension.

## 2015-09-13 IMAGING — CT CT HEAD WITHOUT CONTRAST
4 of 5 series · 15 of 33 positions shown, 18 images · non-contrast
Comparison: None.

CLINICAL DATA: Found unresponsive at bottom of stairs. Suspected
fall. Altered mental status.

EXAM:
CT HEAD WITHOUT CONTRAST
CT CERVICAL SPINE WITHOUT CONTRAST
TECHNIQUE: Multidetector CT imaging of the head and cervical spine was
performed following the standard protocol without intravenous
contrast. Multiplanar CT image reconstructions of the cervical spine
were also generated.

[Series 5: c spine soft · axial · 0.32mm/px · z∈[+278,+324]mm · 2 of 92 slices shown]
[im 23/92  soft-tissue]
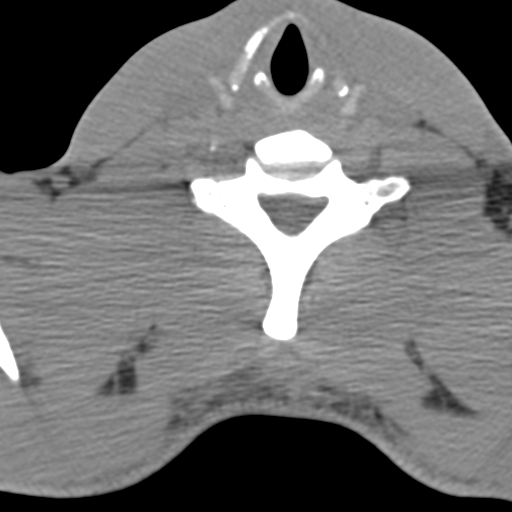
[im 46/92  soft-tissue]
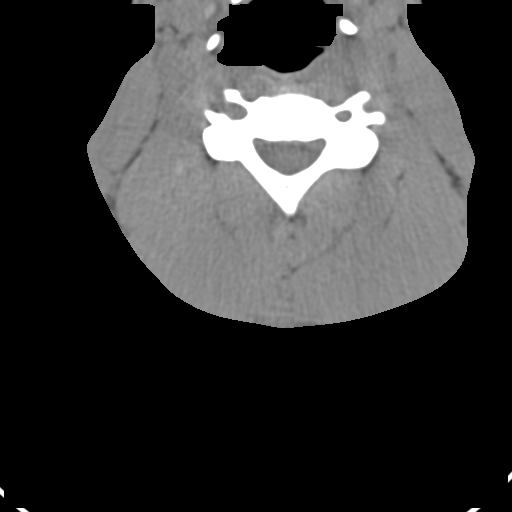

[Series 8: sag bone · sagittal · 0.38mm/px · 5 of 55 slices shown, 6 images]
[im 19/55  bone]
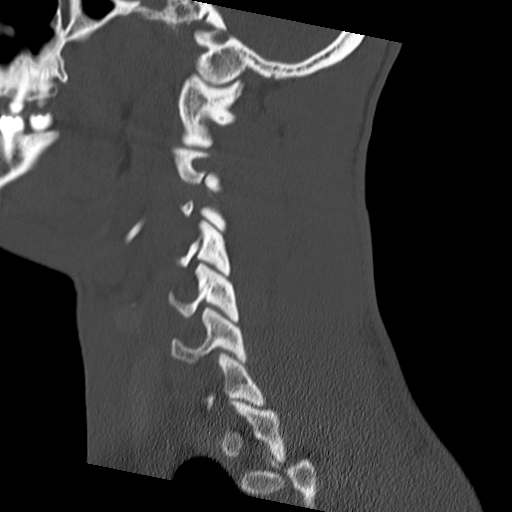
[im 23/55  bone]
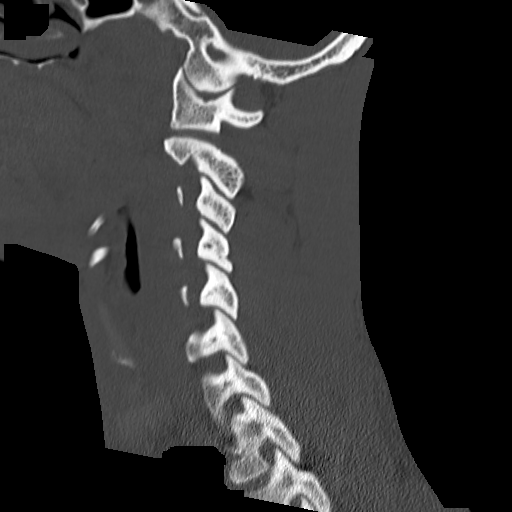
[im 28/55  soft-tissue]
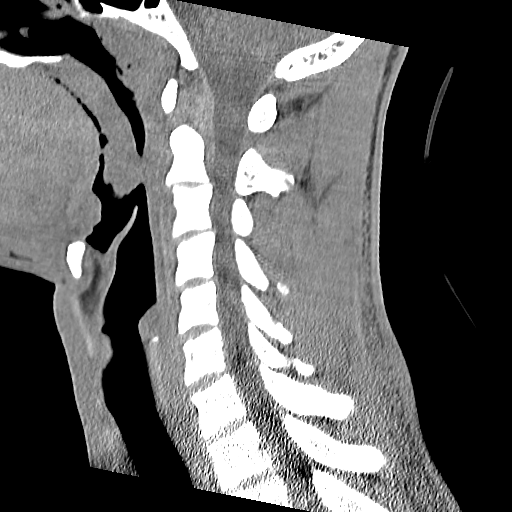
[im 28/55  bone]
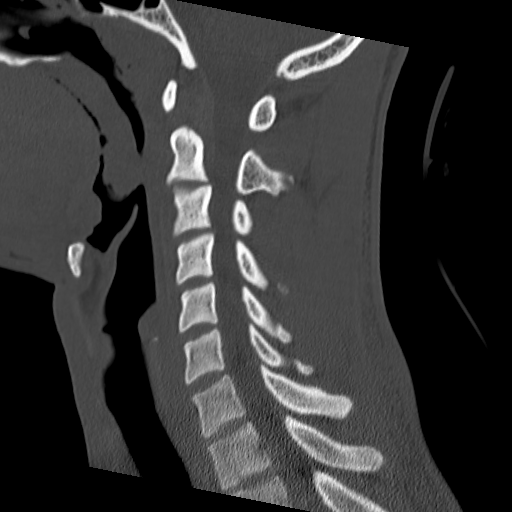
[im 32/55  bone]
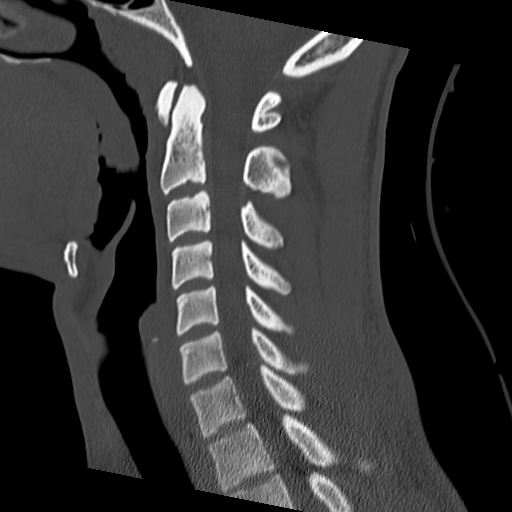
[im 37/55  bone]
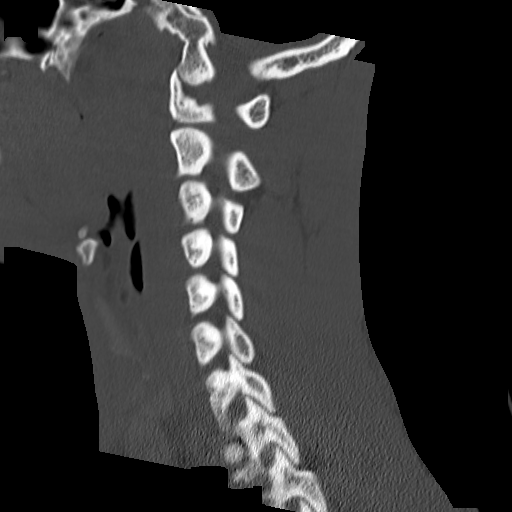

[Series 9: cor bone · coronal · 0.31mm/px · 3 of 42 slices shown]
[im 9/42  bone]
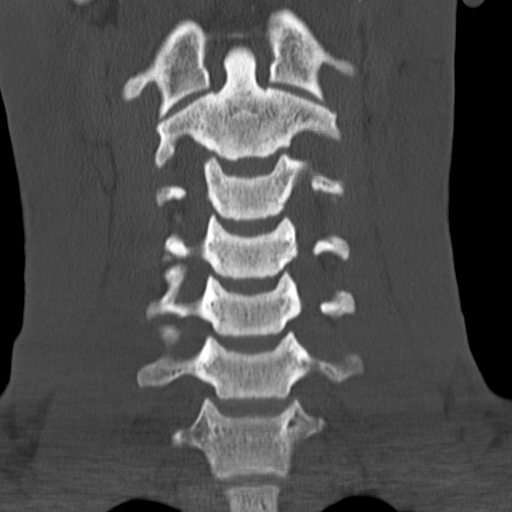
[im 17/42  bone]
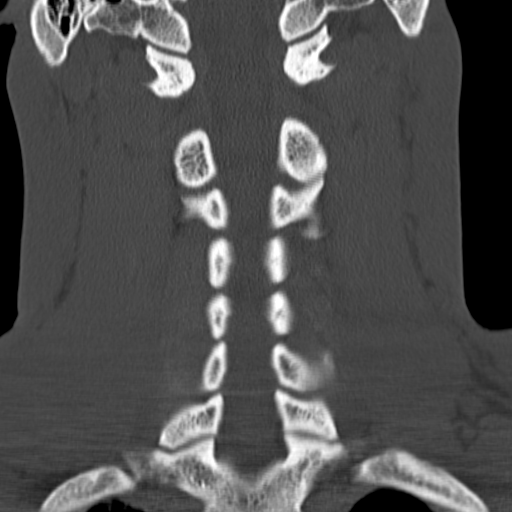
[im 25/42  bone]
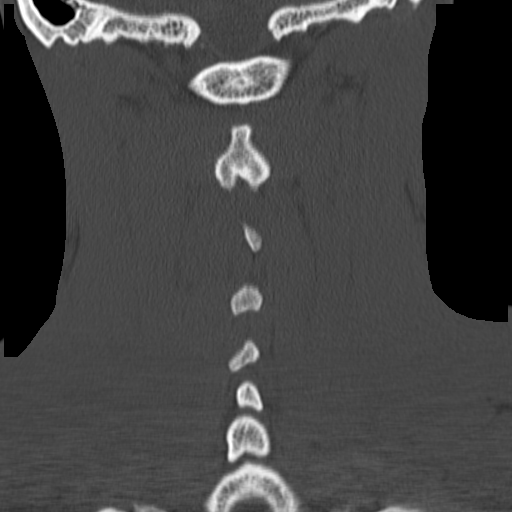

[Series 10: orthogonal axials · axial · 0.24mm/px · z∈[+234,+378]mm · 5 of 116 slices shown, 7 images]
[im 20/116  soft-tissue]
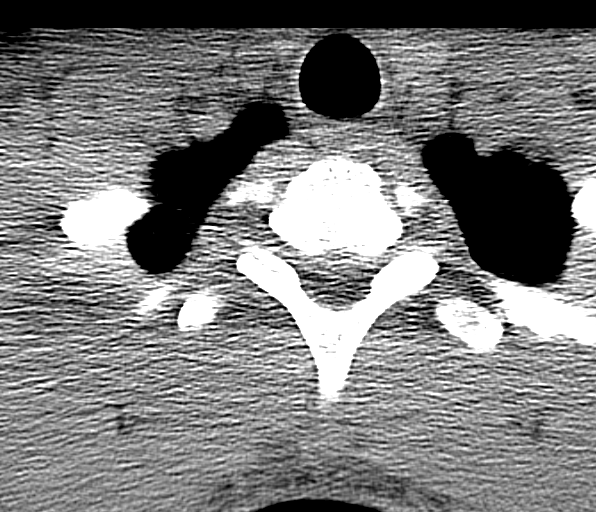
[im 20/116  bone]
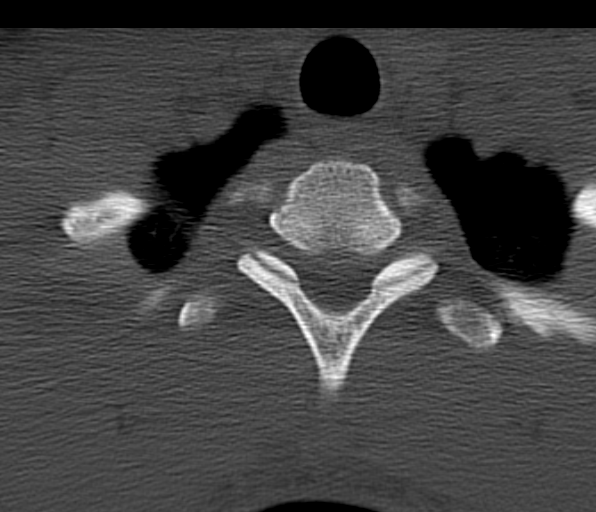
[im 39/116  bone]
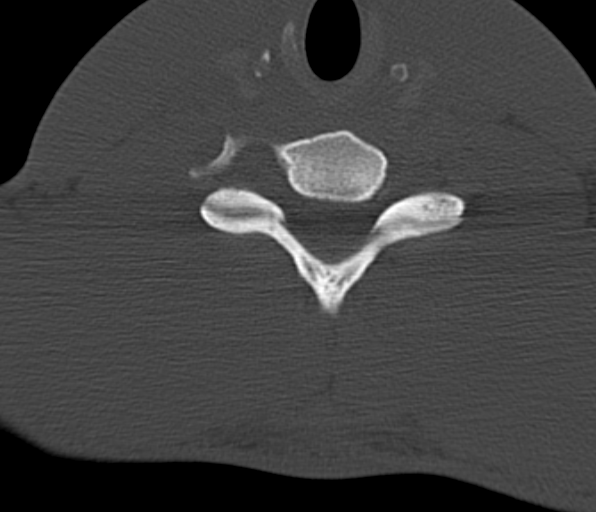
[im 58/116  bone]
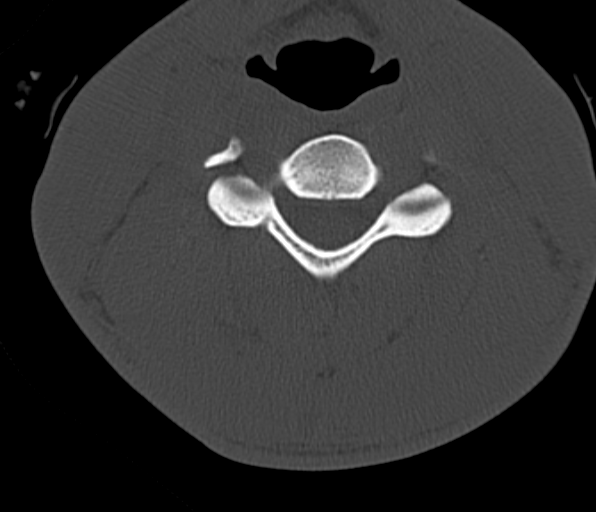
[im 77/116  bone]
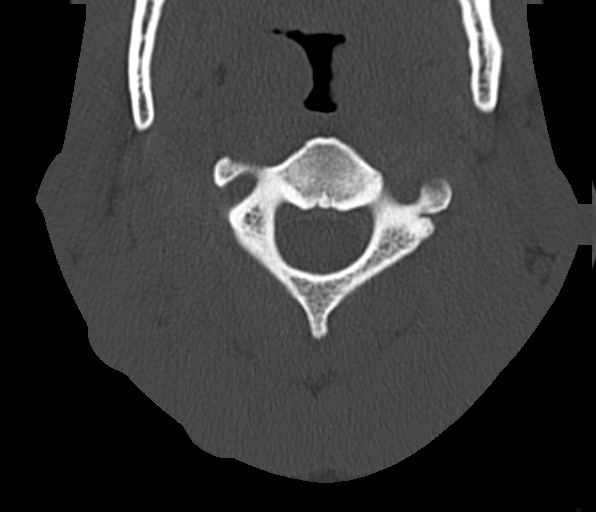
[im 96/116  soft-tissue]
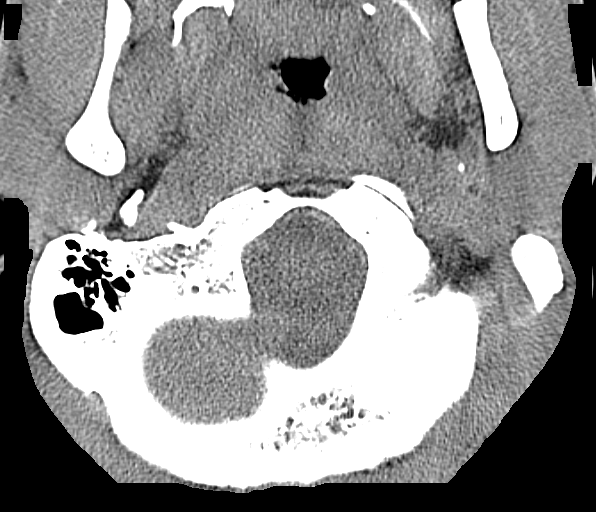
[im 96/116  bone]
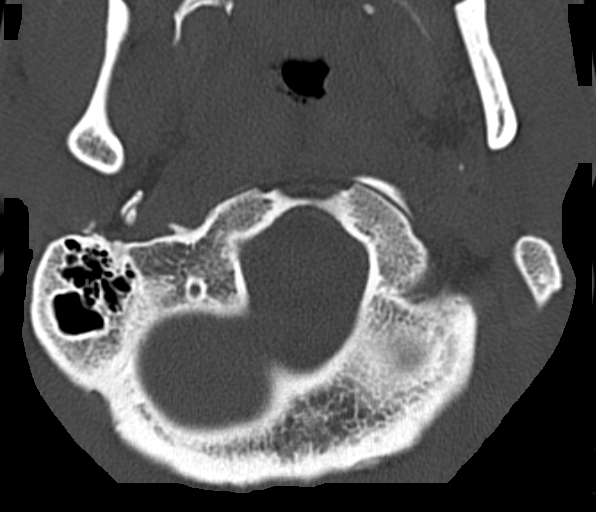

[15 of 33 positions shown; findings below may reference images not displayed]

FINDINGS: CT HEAD FINDINGS

No evidence for acute infarction, hemorrhage, mass lesion,
hydrocephalus, or extra-axial fluid. There is no atrophy or white
matter disease. There is no skull fracture or pneumocephalus. There
is no sinus or mastoid fluid.

CT CERVICAL SPINE FINDINGS

There is no visible cervical spine fracture, traumatic subluxation,
prevertebral soft tissue swelling, or intraspinal hematoma.
Intervertebral disc spaces are preserved. No facet disease or
posterior element fracture. Odontoid and craniocervical junction
intact. Mild nasopharyngeal adenoidal hypertrophy. There is
subcutaneous edema of the posterior soft tissues of the neck and
upper thoracic region, but no visible laceration. There is no
pneumothorax or neck mass.
IMPRESSION: Negative CT head without contrast.

No cervical spine fracture or traumatic subluxation. Subcutaneous
edema over the posterior soft tissues of the neck.

## 2015-09-13 IMAGING — CR DG CHEST 1V PORT
1 series · 1 of 1 positions shown · non-contrast
Comparison: None.

CLINICAL DATA: Unresponsive

EXAM:
PORTABLE CHEST - 1 VIEW

[x chest ap]
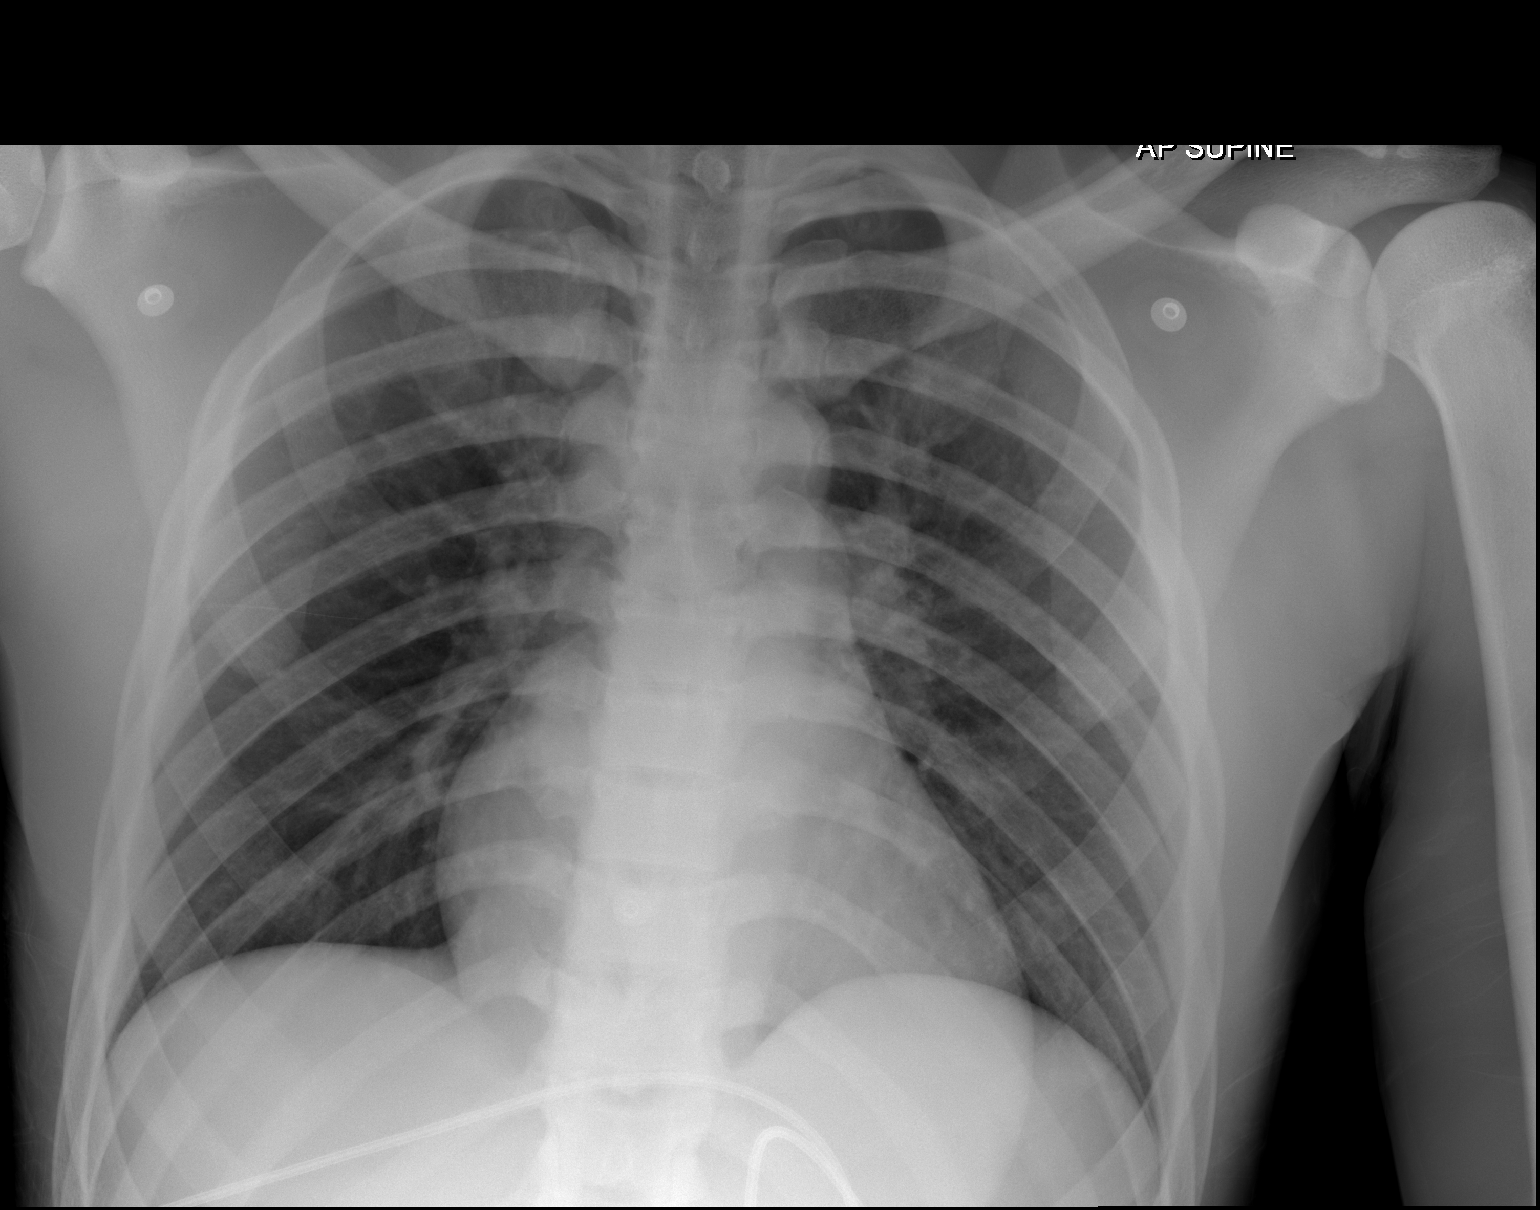

[1 of 1 positions shown; findings below may reference images not displayed]

FINDINGS: Cardiomediastinal silhouette is unremarkable. No acute infiltrate or
pleural effusion. No pulmonary edema. No pneumothorax. No gross
fractures.
IMPRESSION: No active disease.
